# Patient Record
Sex: Male | Born: 1990 | Race: White | Hispanic: No | Marital: Single | Smoking: Never smoker
Health system: Southern US, Community
[De-identification: ages and names within clinical notes are randomized; demographics above are authoritative.]

---
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
## 2016-10-27 DIAGNOSIS — H9201 Otalgia, right ear: Secondary | ICD-10-CM | POA: Diagnosis not present

## 2016-11-17 ENCOUNTER — Telehealth: Payer: Self-pay | Admitting: Family Medicine

## 2016-11-17 ENCOUNTER — Ambulatory Visit (INDEPENDENT_AMBULATORY_CARE_PROVIDER_SITE_OTHER): Payer: 59 | Admitting: Family Medicine

## 2016-11-17 ENCOUNTER — Encounter: Payer: Self-pay | Admitting: Family Medicine

## 2016-11-17 VITALS — BP 110/70 | HR 64 | Temp 97.9°F | Ht 76.0 in | Wt 183.8 lb

## 2016-11-17 DIAGNOSIS — H6981 Other specified disorders of Eustachian tube, right ear: Secondary | ICD-10-CM

## 2016-11-17 DIAGNOSIS — Z7189 Other specified counseling: Secondary | ICD-10-CM

## 2016-11-17 DIAGNOSIS — H6980 Other specified disorders of Eustachian tube, unspecified ear: Secondary | ICD-10-CM | POA: Insufficient documentation

## 2016-11-17 NOTE — Telephone Encounter (Signed)
Mom called wanting to get pt into see you.  PT dad (Danny Bradshaw ) and brother (adam Chestine Sporeclark) sees you Can pt be worked in sooner.  PT thinks he has qtip in right ear.  Pt seen dr 3 weeks in [place] gave him  Ear drops not any better.  I gave them phone numbers to ENT office they will ENT today to see if they can get pt in with someone there

## 2016-11-17 NOTE — Progress Notes (Signed)
New patient.  R ear sx.  Had gone to outside clinic since it was hurting after losing the end of a qtip in the ear.  That was about 3 weeks ago.  No L ear sx.  Now with some R ear pain.  It still feels abnormal, with an intermittent dull ache.  No FNCAVD.  He doesn't feel unwell o/w.   Prev used ciprodex but that didn't help, not on med now.    Advance directive d/w pt.  Meagan Hamilton designated if patient were incapacitated.    PMH and SH reviewed  ROS: Per HPI unless specifically indicated in ROS section   Meds, vitals, and allergies reviewed.   nad ncat Neck supple, no LA rrr ctab abd soft, normal BS B TM w/o erythema but no R TM movement with valsalva A few small white particles on the TM but they are way too small to be fibers from the Q-tip and they do not look typical for thrush. Ear canal and eardrum does not show any sign of infection. I question if this is a residual from irritated skin, discussed the patient. This is likely incidental and not related to his current symptoms that are most likely due to eustachian tube dysfunction.

## 2016-11-17 NOTE — Telephone Encounter (Signed)
We were full on the schedule today.  If he can't get in with ENT today let me know and we'll try to work him in. If he can get in with ENT then keep that appointment. Thanks.

## 2016-11-17 NOTE — Telephone Encounter (Signed)
Error

## 2016-11-17 NOTE — Telephone Encounter (Signed)
Appt scheduled per Dr. Para Marchuncan.

## 2016-11-17 NOTE — Patient Instructions (Addendum)
Gently try to pop your ears, use nasal saline and then use flonse if not better.  Take care.  Glad to see you.  Update me as needed.

## 2016-11-19 DIAGNOSIS — Z7189 Other specified counseling: Secondary | ICD-10-CM | POA: Insufficient documentation

## 2016-11-19 NOTE — Assessment & Plan Note (Signed)
Advance directive d/w pt.  Danny Bradshaw designated if patient were incapacitated.

## 2016-11-19 NOTE — Assessment & Plan Note (Signed)
See above. Use nasal saline and gently try to pop the ear with Valsalva. Can use Flonase if needed. Update me as needed. He agrees.  >20 minutes spent in face to face time with patient, >50% spent in counselling or coordination of care
